# Patient Record
Sex: Female | Born: 1995 | Race: White | Hispanic: No | Marital: Single | Smoking: Never smoker
Health system: Southern US, Community
[De-identification: ages and names within clinical notes are randomized; demographics above are authoritative.]

## PROBLEM LIST (undated history)

## (undated) DIAGNOSIS — F32A Depression, unspecified: Secondary | ICD-10-CM

## (undated) DIAGNOSIS — F329 Major depressive disorder, single episode, unspecified: Secondary | ICD-10-CM

---

## 2014-12-13 ENCOUNTER — Inpatient Hospital Stay (HOSPITAL_COMMUNITY)
Admission: AD | Admit: 2014-12-13 | Discharge: 2014-12-13 | Disposition: A | Discharge status: Home / Self Care | Payer: Self-pay | Source: Ambulatory Visit | Attending: Obstetrics & Gynecology | Admitting: Obstetrics & Gynecology

## 2014-12-13 ENCOUNTER — Encounter (HOSPITAL_COMMUNITY): Payer: Self-pay

## 2014-12-13 DIAGNOSIS — T814XXA Infection following a procedure, initial encounter: Secondary | ICD-10-CM

## 2014-12-13 DIAGNOSIS — O860 Infection of obstetric surgical wound: Secondary | ICD-10-CM | POA: Insufficient documentation

## 2014-12-13 DIAGNOSIS — T8189XA Other complications of procedures, not elsewhere classified, initial encounter: Secondary | ICD-10-CM

## 2014-12-13 DIAGNOSIS — IMO0001 Reserved for inherently not codable concepts without codable children: Secondary | ICD-10-CM

## 2014-12-13 HISTORY — DX: Depression, unspecified: F32.A

## 2014-12-13 HISTORY — DX: Major depressive disorder, single episode, unspecified: F32.9

## 2014-12-13 LAB — POCT PREGNANCY, URINE: PREG TEST UR: NEGATIVE

## 2014-12-13 LAB — CBC WITH DIFFERENTIAL/PLATELET
BASOS ABS: 0.1 10*3/uL (ref 0.0–0.1)
Basophils Relative: 1 % (ref 0–1)
Eosinophils Absolute: 0.4 10*3/uL (ref 0.0–0.7)
Eosinophils Relative: 4 % (ref 0–5)
HCT: 34.3 % — ABNORMAL LOW (ref 36.0–46.0)
Hemoglobin: 11.2 g/dL — ABNORMAL LOW (ref 12.0–15.0)
LYMPHS ABS: 3.1 10*3/uL (ref 0.7–4.0)
Lymphocytes Relative: 34 % (ref 12–46)
MCH: 27.6 pg (ref 26.0–34.0)
MCHC: 32.7 g/dL (ref 30.0–36.0)
MCV: 84.5 fL (ref 78.0–100.0)
MONOS PCT: 6 % (ref 3–12)
Monocytes Absolute: 0.6 10*3/uL (ref 0.1–1.0)
Neutro Abs: 5.1 10*3/uL (ref 1.7–7.7)
Neutrophils Relative %: 55 % (ref 43–77)
Platelets: 375 10*3/uL (ref 150–400)
RBC: 4.06 MIL/uL (ref 3.87–5.11)
RDW: 14.3 % (ref 11.5–15.5)
WBC: 9.3 10*3/uL (ref 4.0–10.5)

## 2014-12-13 LAB — URINALYSIS, ROUTINE W REFLEX MICROSCOPIC
BILIRUBIN URINE: NEGATIVE
Glucose, UA: NEGATIVE mg/dL
HGB URINE DIPSTICK: NEGATIVE
Ketones, ur: NEGATIVE mg/dL
Leukocytes, UA: NEGATIVE
NITRITE: NEGATIVE
Protein, ur: NEGATIVE mg/dL
UROBILINOGEN UA: 0.2 mg/dL (ref 0.0–1.0)
pH: 6 (ref 5.0–8.0)

## 2014-12-13 MED ORDER — SULFAMETHOXAZOLE-TRIMETHOPRIM 800-160 MG PO TABS
1.0000 | ORAL_TABLET | Freq: Once | ORAL | Status: AC
  Administered 2014-12-13: 1 via ORAL
  Filled 2014-12-13: qty 1

## 2014-12-13 MED ORDER — SULFAMETHOXAZOLE-TRIMETHOPRIM 800-160 MG PO TABS
1.0000 | ORAL_TABLET | Freq: Two times a day (BID) | ORAL | Status: AC
Start: 2014-12-13 — End: ?

## 2014-12-13 NOTE — MAU Provider Note (Signed)
Chief Complaint: Post-op Problem   First Provider Initiated Contact with Patient 12/13/14 0200     SUBJECTIVE HPI: Tiffany Mcgrath is a 19 y.o. G1P1001 3 and half months status post C-section on 08/31/2014 who presents to Maternity Admissions reporting complications with healing of C-section incision.   1. At approximately 5 weeks postpartum she went to [facility] for fever of 103 and pain and opening of her incision. She states she was given IV antibiotics and had a CT scan.  2. She's been followed by her [facility] at [facility]  in [place] for incision dehiscence at the right end of her incision. Her mother has been packing it and they see the OB/GYN weekly. She states she has been on multiple rounds of antibiotics, one of which was clindamycin, but she doesn't remember the names of the others. She states she finished a course of clindamycin 10 days ago. This area is no longer painful and has been requiring less packing.  3. Approximately 3 weeks ago the patient began to have moderate to severe pain and redness above the left end of the incision. She states she showed her OB/GYN and was told that it was an irritated stretch mark. Since then the redness has spread, pain has increased and her mother became very concerned when it drained foul-smelling drainage tonight, causing her to bring her daughter to [facility] for a second opinion.  The patient's mother has documented the progression of the incision complications via photos.  Past Medical History  Diagnosis Date  . Depression    OB History  Gravida Para Term Preterm AB SAB TAB Ectopic Multiple Living  1 1 1       1     # Outcome Date GA Lbr Len/2nd Weight Sex Delivery Anes PTL Lv  1 Term 08/31/14    M CS-LTranv   Y     Complications: Failure to Progress in First Stage     Past Surgical History  Procedure Laterality Date  . Cesarean section     History   Social History  . Marital Status: Single    Spouse  Name: N/A  . Number of Children: N/A  . Years of Education: N/A   Occupational History  . Not on file.   Social History Main Topics  . Smoking status: Never Smoker   . Smokeless tobacco: Not on file  . Alcohol Use: Not on file  . Drug Use: No  . Sexual Activity: Yes    Birth Control/ Protection: Implant   Other Topics Concern  . Not on file   Social History Narrative  . No narrative on file   No current facility-administered medications on file prior to encounter.   No current outpatient prescriptions on file prior to encounter.   No Known Allergies  Review of Systems  Constitutional: Negative for fever and chills.  Gastrointestinal: Negative for abdominal pain.  Skin:       Positive for erythema, tenderness and foul-smelling drainage from left end of C-section incision.   OBJECTIVE Blood pressure 125/68, pulse 85, temperature 98.3 F (36.8 C), temperature source Oral, resp. rate 18, height 5\' 7"  (1.702 m), weight 282 lb (127.914 kg), last menstrual period 12/06/2014, SpO2 99 %. GENERAL: Well-developed, well-nourished female in no acute distress. Mild-moderate distress with lifting of pannus and palpation near left end of incision. HEART: normal rate RESP: normal effort GI: Abdomen soft, non-tender to deep palpation. SKIN:  4X6 cm severely tender area of erythema with multiple ulcerations and  scant, purulent, malodorous drainage above the left end of the incision. 0.5 cm deep. 1 cm tunneling on either end.   4 cm open area at right and of incision. Nontender, no erythema. Healthy granulation tissue. Negligible tunneling. 1 cm deep. MS: Nontender, no edema NEURO: Alert and oriented SPECULUM EXAM: Deferred  LAB RESULTS Results for orders placed or performed during the hospital encounter of 12/13/14 (from the past 24 hour(s))  Urinalysis, Routine w reflex microscopic     Status: Abnormal   Collection Time: 12/13/14  1:16 AM  Result Value Ref Range   Color, Urine YELLOW  YELLOW   APPearance CLEAR CLEAR   Specific Gravity, Urine >1.030 (H) 1.005 - 1.030   pH 6.0 5.0 - 8.0   Glucose, UA NEGATIVE NEGATIVE mg/dL   Hgb urine dipstick NEGATIVE NEGATIVE   Bilirubin Urine NEGATIVE NEGATIVE   Ketones, ur NEGATIVE NEGATIVE mg/dL   Protein, ur NEGATIVE NEGATIVE mg/dL   Urobilinogen, UA 0.2 0.0 - 1.0 mg/dL   Nitrite NEGATIVE NEGATIVE   Leukocytes, UA NEGATIVE NEGATIVE  CBC with Differential/Platelet     Status: Abnormal   Collection Time: 12/13/14  1:35 AM  Result Value Ref Range   WBC 9.3 4.0 - 10.5 K/uL   RBC 4.06 3.87 - 5.11 MIL/uL   Hemoglobin 11.2 (L) 12.0 - 15.0 g/dL   HCT 16.1 (L) 09.6 - 04.5 %   MCV 84.5 78.0 - 100.0 fL   MCH 27.6 26.0 - 34.0 pg   MCHC 32.7 30.0 - 36.0 g/dL   RDW 40.9 81.1 - 91.4 %   Platelets 375 150 - 400 K/uL   Neutrophils Relative % 55 43 - 77 %   Neutro Abs 5.1 1.7 - 7.7 K/uL   Lymphocytes Relative 34 12 - 46 %   Lymphs Abs 3.1 0.7 - 4.0 K/uL   Monocytes Relative 6 3 - 12 %   Monocytes Absolute 0.6 0.1 - 1.0 K/uL   Eosinophils Relative 4 0 - 5 %   Eosinophils Absolute 0.4 0.0 - 0.7 K/uL   Basophils Relative 1 0 - 1 %   Basophils Absolute 0.1 0.0 - 0.1 K/uL  Pregnancy, urine POC     Status: None   Collection Time: 12/13/14  1:39 AM  Result Value Ref Range   Preg Test, Ur NEGATIVE NEGATIVE    IMAGING No results found.  MAU COURSE CBC with differential.  Discussed patient and mother's concern with worsening condition of incision and interest in a second opinion with Dr. Macon Large. Anyanwu does not think they would be beneficial to have a different OB/GYN attempt to manage what has already proven to be complicated wound healing. She recommends that the patient see a wound specialist, preferably somewhere near [place], where the patient lives. [facility] has a wound care and hyperbaric center. Patient and mother agree with this plan of care. Contact information given. Encouraged them to discuss any referral needs with  the patient's OB/GYN or primary care doctor.   Per consult with Dr. Macon Large Rx Bactrim DS twice a day 1 week.  ASSESSMENT 1. Non-healing surgical wound, initial encounter   2. Postoperative wound infection, initial encounter     PLAN Discharge home in stable condition per consult with Dr. Macon Large. Continue packing original wound as instructed. Follow-up with OB/GYN Tuesday, 12/14/2014 as scheduled and discuss wound specialist referral has needed.     Follow-up Information    Schedule an appointment as soon as possible for a visit with [facility].   Contact information:   [address], [place], [place] [address] Call [phone] [facility]).       Follow up with [facility].   Why:  As needed in emergencies   Contact information:   [address] [place] [address]        Medication List    TAKE these medications        citalopram 10 MG tablet  Commonly known as:  CELEXA  Take 10 mg by mouth daily.     etonogestrel 68 MG Impl implant  Commonly known as:  NEXPLANON  1 each by Subdermal route once.     ibuprofen 800 MG tablet  Commonly known as:  ADVIL,MOTRIN  Take 800 mg by mouth every 8 (eight) hours as needed for moderate pain.     oxyCODONE-acetaminophen 5-325 MG per tablet  Commonly known as:  PERCOCET/ROXICET  Take 1 tablet by mouth every 4 (four) hours as needed for severe pain.     prenatal multivitamin Tabs tablet  Take 1 tablet by mouth daily at 12 noon.     sulfamethoxazole-trimethoprim 800-160 MG per tablet  Commonly known as:  BACTRIM DS,SEPTRA DS  Take 1 tablet by mouth 2 (two) times daily.       [place], CNM 12/13/2014  2:58 AM

## 2014-12-13 NOTE — MAU Note (Signed)
C/section at [facility] on 08/31/14. [facility] to [facility] [place]. Wants 2nd opinion about incision. Has open spots and brown drainage with odor. Denies fever. Pt's mother has been packing one of the holes.  LMP was last week.  Incisional pain and abdominal tenderness.

## 2014-12-13 NOTE — Discharge Instructions (Signed)
Wound Infection °A wound infection happens when a type of germ (bacteria) starts growing in the wound. In some cases, this can cause the wound to break open. If cared for properly, the infected wound will heal from the inside to the outside. Wound infections need treatment. °CAUSES °An infection is caused by bacteria growing in the wound.  °SYMPTOMS  °· Increase in redness, swelling, or pain at the wound site. °· Increase in drainage at the wound site. °· Wound or bandage (dressing) starts to smell bad. °· Fever. °· Feeling tired or fatigued. °· Pus draining from the wound. °TREATMENT  °Your health care provider will prescribe antibiotic medicine. The wound infection should improve within 24 to 48 hours. Any redness around the wound should stop spreading and the wound should be less painful.  °HOME CARE INSTRUCTIONS  °· Only take over-the-counter or prescription medicines for pain, discomfort, or fever as directed by your health care provider. °· Take your antibiotics as directed. Finish them even if you start to feel better. °· Gently wash the area with mild soap and water 2 times a day, or as directed. Rinse off the soap. Pat the area dry with a clean towel. Do not rub the wound. This may cause bleeding. °· Follow your health care provider's instructions for how often you need to change the dressing. °· Apply ointment and a dressing to the wound as directed. °· If the dressing sticks, moisten it with soapy water and gently remove it. °· Change the bandage right away if it becomes wet, dirty, or develops a bad smell. °· Take showers. Do not take tub baths, swim, or do anything that may soak the wound until it is healed. °· Avoid exercises that make you sweat heavily. °· Use anti-itch medicine as directed by your health care provider. The wound may itch when it is healing. Do not pick or scratch at the wound. °· Follow up with your health care provider to get your wound rechecked as directed. °SEEK MEDICAL CARE  IF: °· You have an increase in swelling, pain, or redness around the wound. °· You have an increase in the amount of pus coming from the wound. °· There is a bad smell coming from the wound. °· More of the wound breaks open. °· You have a fever. °MAKE SURE YOU:  °· Understand these instructions. °· Will watch your condition. °· Will get help right away if you are not doing well or get worse. °Document Released: 04/07/2003 Document Revised: 07/14/2013 Document Reviewed: 11/12/2010 °Exit[employer]® Patient Information ©2015 [employer]. This information is not intended to replace advice given to you by your health care provider. Make sure you discuss any questions you have with your health care provider. ° °

## 2016-09-27 ENCOUNTER — Encounter (HOSPITAL_COMMUNITY): Payer: Self-pay | Admitting: Specialist

## 2016-09-27 ENCOUNTER — Other Ambulatory Visit (HOSPITAL_COMMUNITY): Payer: Self-pay | Admitting: Specialist

## 2016-09-27 DIAGNOSIS — Z3A23 23 weeks gestation of pregnancy: Secondary | ICD-10-CM

## 2016-09-27 DIAGNOSIS — O4102X0 Oligohydramnios, second trimester, not applicable or unspecified: Secondary | ICD-10-CM

## 2016-09-27 DIAGNOSIS — Z3689 Encounter for other specified antenatal screening: Secondary | ICD-10-CM

## 2016-10-01 ENCOUNTER — Encounter (HOSPITAL_COMMUNITY): Payer: Self-pay | Admitting: *Deleted

## 2016-10-02 ENCOUNTER — Other Ambulatory Visit (HOSPITAL_COMMUNITY): Payer: Self-pay | Admitting: Specialist

## 2016-10-02 ENCOUNTER — Ambulatory Visit (HOSPITAL_COMMUNITY): Admission: RE | Admit: 2016-10-02 | Payer: Medicaid Other | Source: Ambulatory Visit

## 2016-10-02 ENCOUNTER — Other Ambulatory Visit (HOSPITAL_COMMUNITY): Payer: Self-pay | Admitting: *Deleted

## 2016-10-02 ENCOUNTER — Encounter (HOSPITAL_COMMUNITY): Payer: Self-pay

## 2016-10-02 ENCOUNTER — Ambulatory Visit (HOSPITAL_COMMUNITY)
Admission: RE | Admit: 2016-10-02 | Discharge: 2016-10-02 | Disposition: A | Discharge status: Home / Self Care | Payer: Medicaid Other | Source: Ambulatory Visit | Attending: Specialist | Admitting: Specialist

## 2016-10-02 DIAGNOSIS — Z3A23 23 weeks gestation of pregnancy: Secondary | ICD-10-CM

## 2016-10-02 DIAGNOSIS — Z3A18 18 weeks gestation of pregnancy: Secondary | ICD-10-CM | POA: Diagnosis not present

## 2016-10-02 DIAGNOSIS — O4102X0 Oligohydramnios, second trimester, not applicable or unspecified: Secondary | ICD-10-CM

## 2016-10-02 DIAGNOSIS — Z3492 Encounter for supervision of normal pregnancy, unspecified, second trimester: Secondary | ICD-10-CM

## 2016-10-02 DIAGNOSIS — O26842 Uterine size-date discrepancy, second trimester: Secondary | ICD-10-CM

## 2016-10-02 DIAGNOSIS — O99212 Obesity complicating pregnancy, second trimester: Secondary | ICD-10-CM

## 2016-10-02 DIAGNOSIS — IMO0002 Reserved for concepts with insufficient information to code with codable children: Secondary | ICD-10-CM

## 2016-10-02 DIAGNOSIS — O34219 Maternal care for unspecified type scar from previous cesarean delivery: Secondary | ICD-10-CM

## 2016-10-02 DIAGNOSIS — O34211 Maternal care for low transverse scar from previous cesarean delivery: Secondary | ICD-10-CM | POA: Diagnosis not present

## 2016-10-02 DIAGNOSIS — Z3687 Encounter for antenatal screening for uncertain dates: Secondary | ICD-10-CM | POA: Insufficient documentation

## 2016-10-02 DIAGNOSIS — Z0489 Encounter for examination and observation for other specified reasons: Secondary | ICD-10-CM

## 2016-10-02 DIAGNOSIS — Z3689 Encounter for other specified antenatal screening: Secondary | ICD-10-CM

## 2016-10-23 ENCOUNTER — Encounter (HOSPITAL_COMMUNITY): Payer: Self-pay

## 2016-10-23 ENCOUNTER — Ambulatory Visit (HOSPITAL_COMMUNITY)
Admission: RE | Admit: 2016-10-23 | Discharge: 2016-10-23 | Disposition: A | Discharge status: Home / Self Care | Payer: Medicaid Other | Source: Ambulatory Visit | Attending: Specialist | Admitting: Specialist

## 2016-10-23 DIAGNOSIS — IMO0002 Reserved for concepts with insufficient information to code with codable children: Secondary | ICD-10-CM

## 2016-10-23 DIAGNOSIS — O99212 Obesity complicating pregnancy, second trimester: Secondary | ICD-10-CM | POA: Insufficient documentation

## 2016-10-23 DIAGNOSIS — O34219 Maternal care for unspecified type scar from previous cesarean delivery: Secondary | ICD-10-CM | POA: Diagnosis not present

## 2016-10-23 DIAGNOSIS — Z362 Encounter for other antenatal screening follow-up: Secondary | ICD-10-CM | POA: Diagnosis not present

## 2016-10-23 DIAGNOSIS — Z3A21 21 weeks gestation of pregnancy: Secondary | ICD-10-CM | POA: Diagnosis not present

## 2016-10-23 DIAGNOSIS — Z0489 Encounter for examination and observation for other specified reasons: Secondary | ICD-10-CM

## 2017-05-27 ENCOUNTER — Encounter (HOSPITAL_COMMUNITY): Payer: Self-pay

## 2017-10-26 IMAGING — US US MFM OB FOLLOW-UP
1 series · 14 of 28 positions shown · non-contrast
Comparison: none

[Series 1: us mfm ob follow-up · 62 acquisitions, 14 frames shown]
[im 3/62]
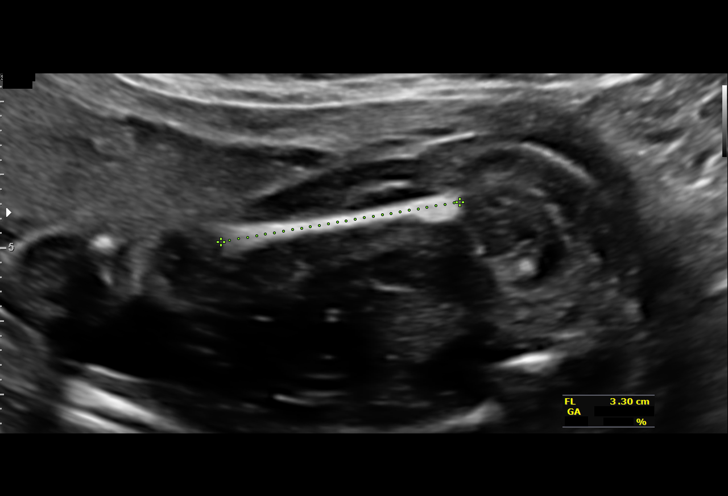
[im 7/62]
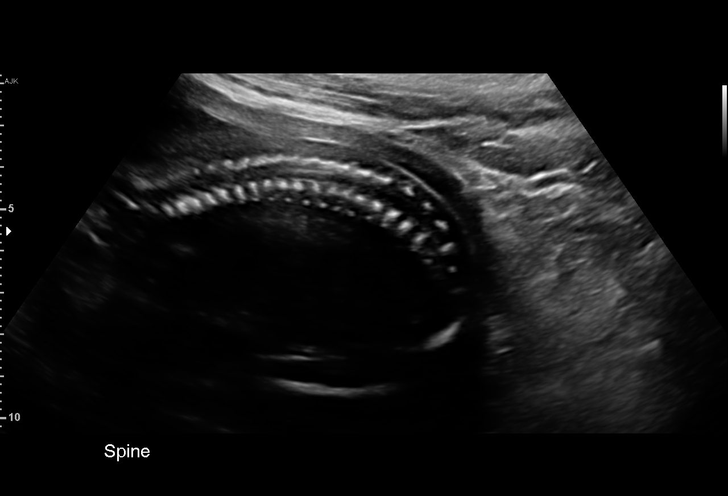
[im 12/62]
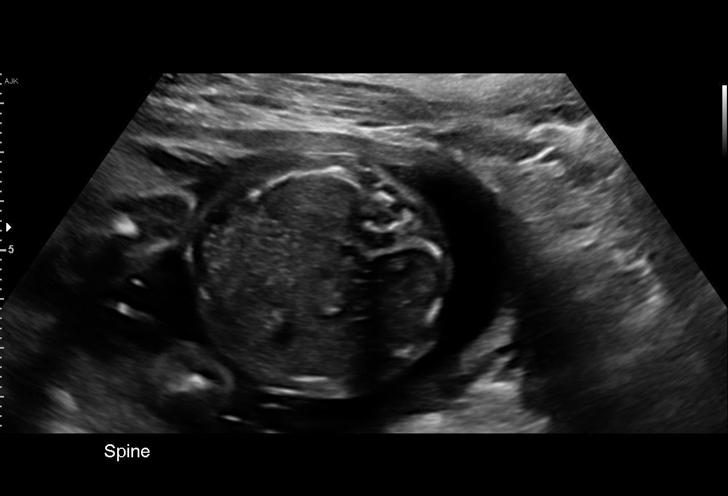
[im 16/62]
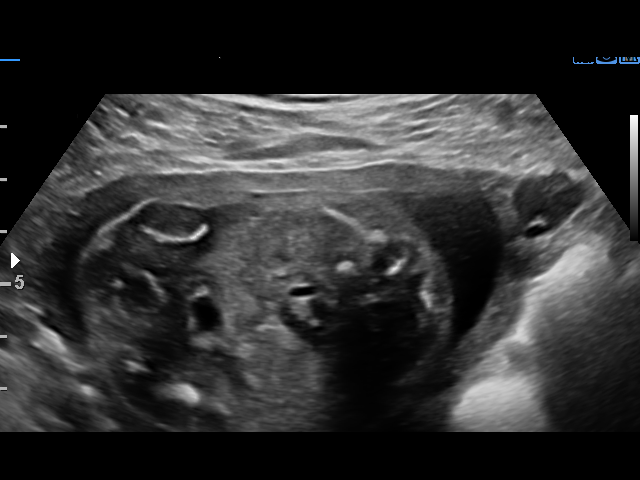
[im 21/62]
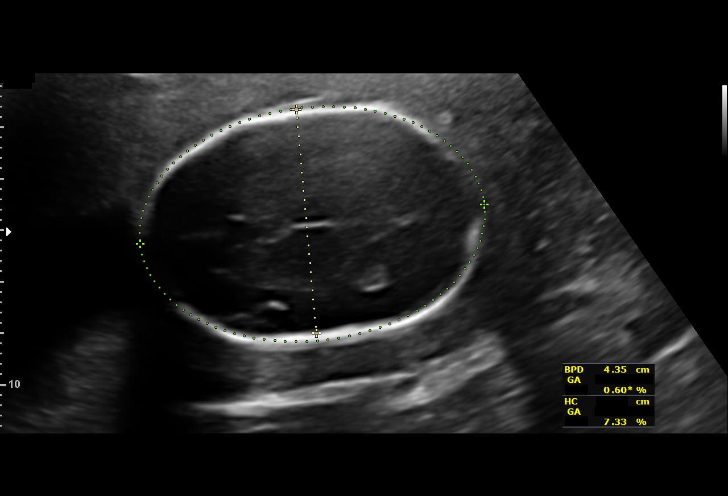
[im 25/62]
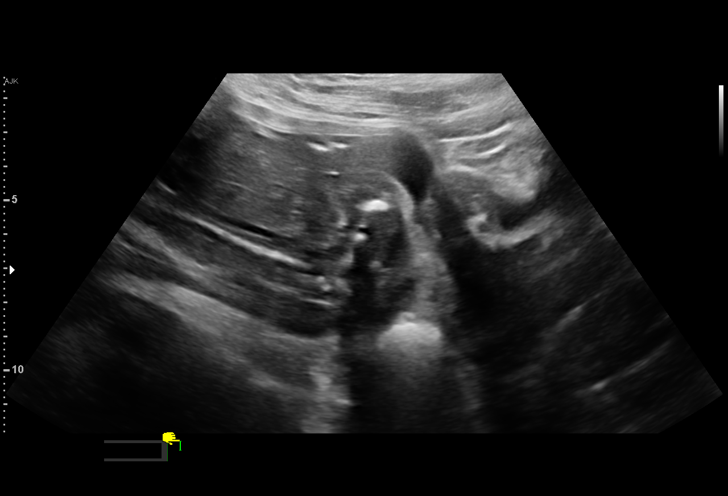
[im 30/62]
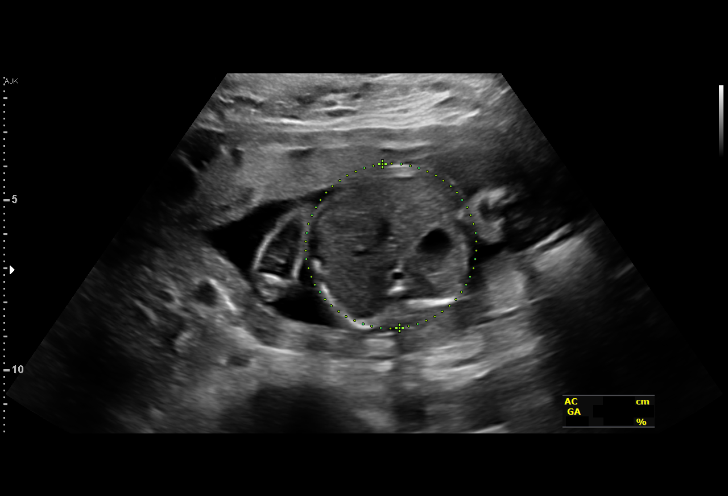
[im 34/62]
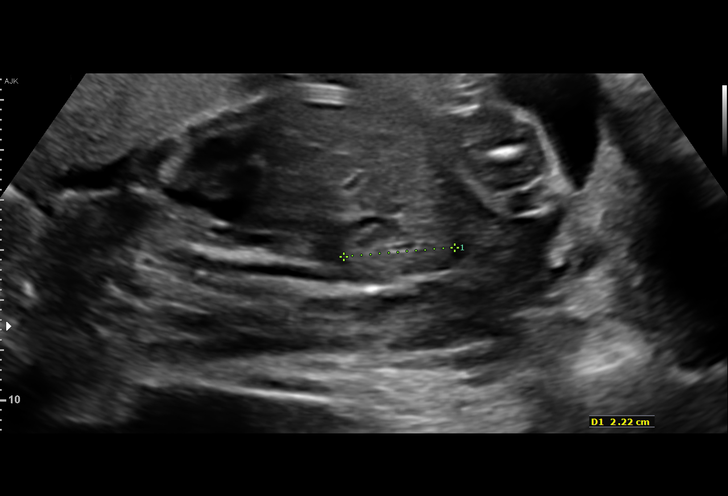
[im 39/62]
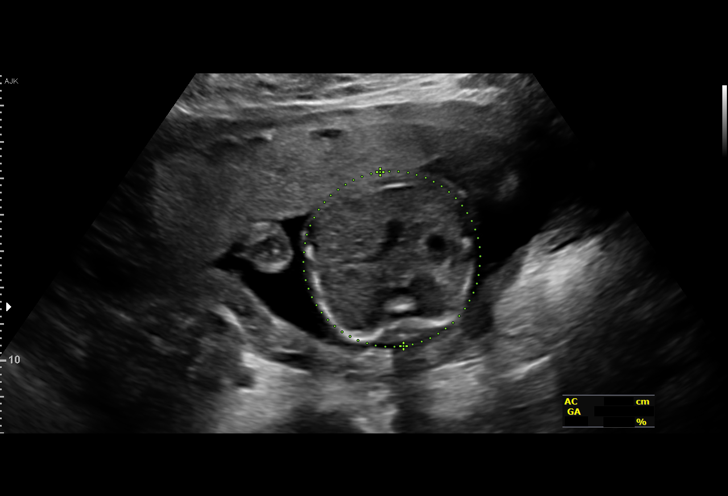
[im 43/62]
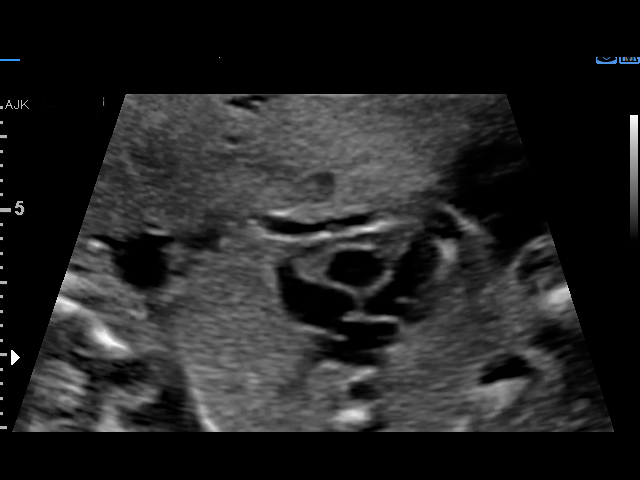
[im 48/62]
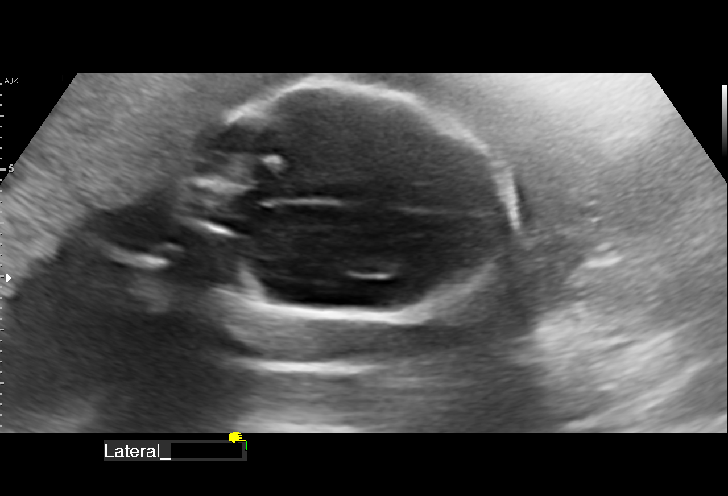
[im 52/62]
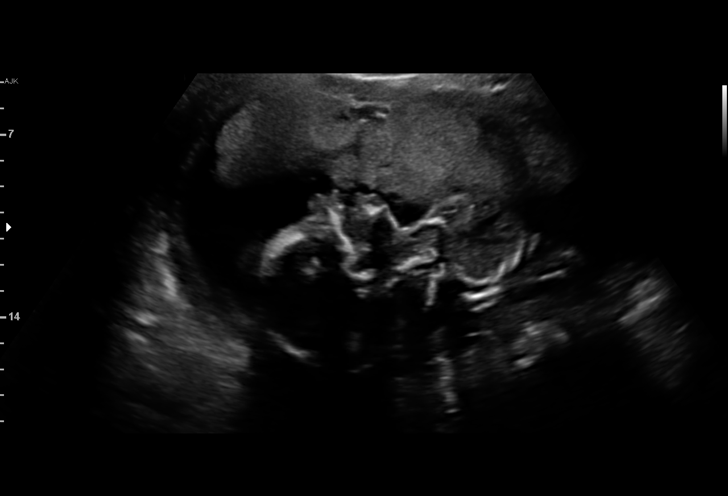
[im 57/62]
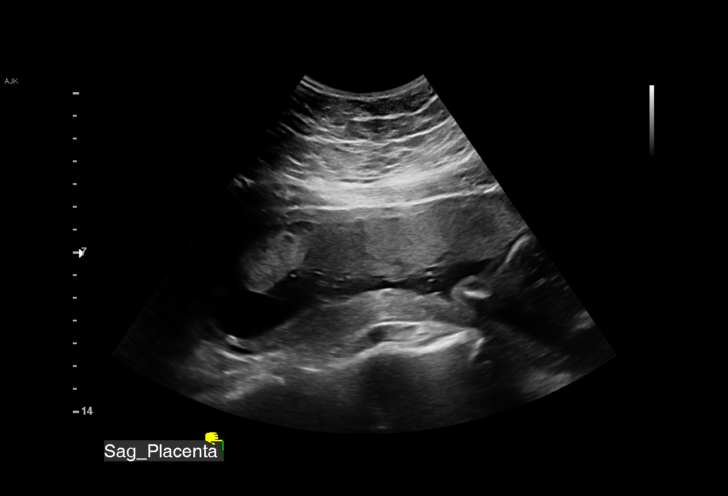
[im 62/62]
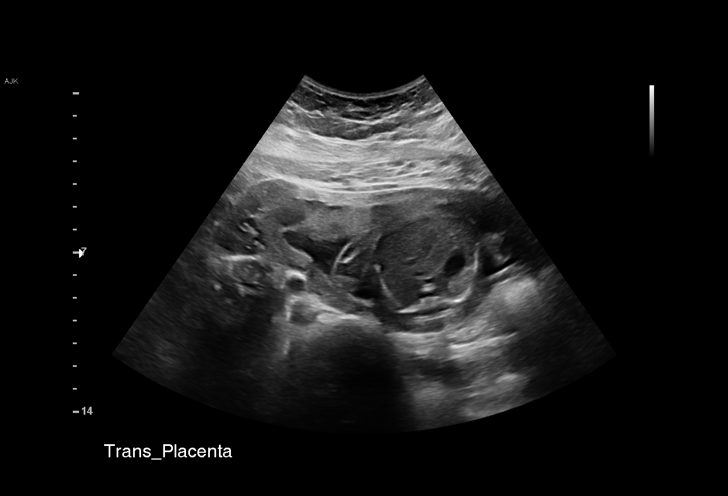

[14 of 28 positions shown; findings below may reference images not displayed]

[HOSPITAL][HOSPITAL]

1  FELNER CEOLA              [id number]      [id number]     [id number]
Indications

21 weeks gestation of pregnancy
Obesity complicating pregnancy, second
trimester
Previous cesarean delivery, antepartum
Encounter for uncertain dates
Encounter for other antenatal screening
follow-up
OB History

Gravidity:    2         Term:   1        Prem:   0        SAB:   0
TOP:          0       Ectopic:  0        Living: 1
Fetal Evaluation

Num Of Fetuses:     1
Fetal Heart         164
Rate(bpm):
Cardiac Activity:   Observed
Presentation:       Breech
Placenta:           Anterior, above cervical os

Largest Pocket(cm)
3.47
Biometry

BPD:      44.7  mm     G. Age:  19w 4d          2  %    CI:        61.28   %   70 - 86
FL/HC:      18.5   %   15.9 -
HC:      184.7  mm     G. Age:  20w 6d         18  %    HC/AC:      1.19       1.06 -
AC:      155.6  mm     G. Age:  20w 5d         22  %    FL/BPD:     76.3   %
FL:       34.1  mm     G. Age:  20w 5d         19  %    FL/AC:      21.9   %   20 - 24
Est. FW:     370  gm    0 lb 13 oz      27  %
Gestational Age

LMP:           26w 1d       Date:   04/23/16                 EDD:   01/28/17
U/S Today:     20w 3d                                        EDD:   03/09/17
Best:          21w 3d    Det. By:   Previous Ultrasound      EDD:   03/02/17
(09/25/16)
Anatomy

Cranium:               Appears normal         Aortic Arch:            Appears normal
Cavum:                 Previously seen        Ductal Arch:            Appears normal
Ventricles:            Appears normal         Diaphragm:              Appears normal
Choroid Plexus:        Previously seen        Stomach:                Appears normal, left
sided
Cerebellum:            Previously seen        Abdomen:                Appears normal
Posterior Fossa:       Previously seen        Abdominal Wall:         Previously seen
Nuchal Fold:           Previously seen        Cord Vessels:           Previously seen
Face:                  Orbits and profile     Kidneys:                Appear normal
previously seen
Lips:                  Previously seen        Bladder:                Appears normal
Thoracic:              Appears normal         Spine:                  Ltd views no
intracranial signs of
NT
Heart:                 Previously seen        Upper Extremities:      Previously seen
RVOT:                  Appears normal         Lower Extremities:      Previously seen
LVOT:                  Appears normal

Other:  Fetus appears to be a male. Technically difficult due to maternal
habitus.
Cervix Uterus Adnexa

Cervix
Length:           4.67  cm.
Normal appearance by transabdominal scan.

Uterus
No abnormality visualized.

Left Ovary
Within normal limits.

Right Ovary
Within normal limits.

Adnexa:       No abnormality visualized.
Impression

Single living intrauterine pregnancy at 88w9d.
Appropriate interval fetal growth (27%).
Normal amniotic fluid volume.
Normal interval fetal anatomy.
Recommendations

Recommend continue using EDD 03/02/17. Follow-up
ultrasounds as clinically indicated.
